# Patient Record
Sex: Male | Born: 1970 | Race: Black or African American | Hispanic: No | Marital: Single
Health system: Southern US, Community
[De-identification: ages and names within clinical notes are randomized; demographics above are authoritative.]

---

## 2012-10-14 ENCOUNTER — Ambulatory Visit (HOSPITAL_COMMUNITY)
Admission: AD | Admit: 2012-10-14 | Discharge: 2012-10-14 | Disposition: A | Discharge status: Home / Self Care | Payer: Medicare Other | Source: Other Acute Inpatient Hospital | Attending: Internal Medicine | Admitting: Internal Medicine

## 2012-10-14 ENCOUNTER — Other Ambulatory Visit (HOSPITAL_COMMUNITY): Payer: Medicare Other

## 2012-10-14 ENCOUNTER — Inpatient Hospital Stay
Admission: AD | Admit: 2012-10-14 | Discharge: 2012-11-22 | Disposition: A | Discharge status: Skilled Nursing Facility | Payer: Medicare Other | Source: Ambulatory Visit | Attending: Internal Medicine | Admitting: Internal Medicine

## 2012-10-14 DIAGNOSIS — J9600 Acute respiratory failure, unspecified whether with hypoxia or hypercapnia: Secondary | ICD-10-CM | POA: Insufficient documentation

## 2012-10-14 MED ORDER — IOHEXOL 300 MG/ML  SOLN
50.0000 mL | Freq: Once | INTRAMUSCULAR | Status: AC | PRN
  Administered 2012-10-14: 50 mL

## 2012-10-15 ENCOUNTER — Other Ambulatory Visit (HOSPITAL_COMMUNITY): Payer: Medicare Other

## 2012-10-15 LAB — SEDIMENTATION RATE: Sed Rate: 26 mm/hr — ABNORMAL HIGH (ref 0–16)

## 2012-10-15 LAB — CBC
MCH: 28.9 pg (ref 26.0–34.0)
MCV: 84.9 fL (ref 78.0–100.0)
Platelets: 263 10*3/uL (ref 150–400)
RBC: 3.91 MIL/uL — ABNORMAL LOW (ref 4.22–5.81)
RDW: 15.3 % (ref 11.5–15.5)
WBC: 14.9 10*3/uL — ABNORMAL HIGH (ref 4.0–10.5)

## 2012-10-15 LAB — COMPREHENSIVE METABOLIC PANEL
ALT: 53 U/L (ref 0–53)
AST: 45 U/L — ABNORMAL HIGH (ref 0–37)
Albumin: 3 g/dL — ABNORMAL LOW (ref 3.5–5.2)
Alkaline Phosphatase: 96 U/L (ref 39–117)
Calcium: 9.2 mg/dL (ref 8.4–10.5)
Chloride: 100 mEq/L (ref 96–112)
Creatinine, Ser: 0.57 mg/dL (ref 0.50–1.35)
GFR calc Af Amer: >90 mL/min (ref >90)
GFR calc non Af Amer: >90 mL/min (ref >90)
Sodium: 136 mEq/L (ref 135–145)
Total Bilirubin: 0.3 mg/dL (ref 0.3–1.2)

## 2012-10-15 LAB — PREALBUMIN: Prealbumin: 14 mg/dL — ABNORMAL LOW (ref 17.0–34.0)

## 2012-10-18 ENCOUNTER — Other Ambulatory Visit (HOSPITAL_COMMUNITY): Payer: Medicare Other

## 2012-10-18 LAB — CBC
HCT: 36.3 % — ABNORMAL LOW (ref 39.0–52.0)
MCH: 29.1 pg (ref 26.0–34.0)
MCHC: 34.7 g/dL (ref 30.0–36.0)
MCV: 83.8 fL (ref 78.0–100.0)
RDW: 15 % (ref 11.5–15.5)

## 2012-10-18 LAB — BASIC METABOLIC PANEL
BUN: 22 mg/dL (ref 6–23)
Creatinine, Ser: 0.51 mg/dL (ref 0.50–1.35)
GFR calc Af Amer: >90 mL/min (ref >90)
GFR calc non Af Amer: >90 mL/min (ref >90)
Glucose, Bld: 123 mg/dL — ABNORMAL HIGH (ref 70–99)

## 2012-10-19 ENCOUNTER — Other Ambulatory Visit (HOSPITAL_COMMUNITY): Payer: Medicare Other

## 2012-10-22 LAB — CBC
HCT: 35.7 % — ABNORMAL LOW (ref 39.0–52.0)
MCV: 83 fL (ref 78.0–100.0)
Platelets: 290 10*3/uL (ref 150–400)
RBC: 4.3 MIL/uL (ref 4.22–5.81)
WBC: 3.3 10*3/uL — ABNORMAL LOW (ref 4.0–10.5)

## 2012-10-22 LAB — BASIC METABOLIC PANEL
CO2: 27 mEq/L (ref 19–32)
Chloride: 97 mEq/L (ref 96–112)
Creatinine, Ser: 0.64 mg/dL (ref 0.50–1.35)
GFR calc Af Amer: >90 mL/min (ref >90)
Potassium: 4.8 mEq/L (ref 3.5–5.1)

## 2012-10-22 LAB — MAGNESIUM: Magnesium: 1.9 mg/dL (ref 1.5–2.5)

## 2012-10-23 LAB — CULTURE, RESPIRATORY W GRAM STAIN

## 2012-10-24 LAB — CBC WITH DIFFERENTIAL/PLATELET
Basophils Absolute: 0 10*3/uL (ref 0.0–0.1)
Basophils Relative: 0 % (ref 0–1)
Eosinophils Absolute: 0 10*3/uL (ref 0.0–0.7)
Eosinophils Relative: 2 % (ref 0–5)
HCT: 39.1 % (ref 39.0–52.0)
Lymphocytes Relative: 29 % (ref 12–46)
MCH: 29.4 pg (ref 26.0–34.0)
MCHC: 35 g/dL (ref 30.0–36.0)
MCV: 83.9 fL (ref 78.0–100.0)
Monocytes Absolute: 0.3 10*3/uL (ref 0.1–1.0)
Platelets: 240 10*3/uL (ref 150–400)
RDW: 15.2 % (ref 11.5–15.5)
WBC: 2.5 10*3/uL — ABNORMAL LOW (ref 4.0–10.5)

## 2012-10-25 ENCOUNTER — Other Ambulatory Visit (HOSPITAL_COMMUNITY): Payer: Medicare Other

## 2012-10-25 LAB — CBC
HCT: 35.1 % — ABNORMAL LOW (ref 39.0–52.0)
MCHC: 34.8 g/dL (ref 30.0–36.0)
Platelets: 274 10*3/uL (ref 150–400)
RDW: 15 % (ref 11.5–15.5)
WBC: 3.1 10*3/uL — ABNORMAL LOW (ref 4.0–10.5)

## 2012-10-25 LAB — BASIC METABOLIC PANEL
BUN: 14 mg/dL (ref 6–23)
Chloride: 98 mEq/L (ref 96–112)
GFR calc Af Amer: >90 mL/min (ref >90)
GFR calc non Af Amer: >90 mL/min (ref >90)
Potassium: 4.7 mEq/L (ref 3.5–5.1)
Sodium: 134 mEq/L — ABNORMAL LOW (ref 135–145)

## 2012-10-26 LAB — VANCOMYCIN, TROUGH: Vancomycin Tr: 16.5 ug/mL (ref 10.0–20.0)

## 2012-10-26 LAB — CULTURE, RESPIRATORY W GRAM STAIN

## 2012-10-27 ENCOUNTER — Other Ambulatory Visit (HOSPITAL_COMMUNITY): Payer: Medicare Other

## 2012-10-27 LAB — CBC
Hemoglobin: 13 g/dL (ref 13.0–17.0)
Platelets: 286 10*3/uL (ref 150–400)
RBC: 4.4 MIL/uL (ref 4.22–5.81)
WBC: 7.3 10*3/uL (ref 4.0–10.5)

## 2012-10-27 LAB — BASIC METABOLIC PANEL
CO2: 31 mEq/L (ref 19–32)
Calcium: 9.9 mg/dL (ref 8.4–10.5)
Glucose, Bld: 104 mg/dL — ABNORMAL HIGH (ref 70–99)
Potassium: 4.4 mEq/L (ref 3.5–5.1)
Sodium: 136 mEq/L (ref 135–145)

## 2012-10-29 LAB — BASIC METABOLIC PANEL
CO2: 28 mEq/L (ref 19–32)
Calcium: 9.5 mg/dL (ref 8.4–10.5)
Chloride: 100 mEq/L (ref 96–112)
Creatinine, Ser: 0.66 mg/dL (ref 0.50–1.35)
Glucose, Bld: 108 mg/dL — ABNORMAL HIGH (ref 70–99)
Sodium: 138 mEq/L (ref 135–145)

## 2012-10-29 LAB — CBC
Hemoglobin: 11.5 g/dL — ABNORMAL LOW (ref 13.0–17.0)
MCH: 29.2 pg (ref 26.0–34.0)
MCV: 84.5 fL (ref 78.0–100.0)
Platelets: 208 10*3/uL (ref 150–400)
RBC: 3.94 MIL/uL — ABNORMAL LOW (ref 4.22–5.81)
WBC: 9 10*3/uL (ref 4.0–10.5)

## 2012-11-01 ENCOUNTER — Other Ambulatory Visit (HOSPITAL_COMMUNITY): Payer: Medicare Other

## 2012-11-01 LAB — BASIC METABOLIC PANEL
BUN: 14 mg/dL (ref 6–23)
CO2: 28 mEq/L (ref 19–32)
Calcium: 9 mg/dL (ref 8.4–10.5)
Chloride: 99 mEq/L (ref 96–112)
Creatinine, Ser: 0.43 mg/dL — ABNORMAL LOW (ref 0.50–1.35)
Glucose, Bld: 85 mg/dL (ref 70–99)

## 2012-11-01 LAB — CBC
HCT: 29.2 % — ABNORMAL LOW (ref 39.0–52.0)
MCH: 28.6 pg (ref 26.0–34.0)
MCV: 84.4 fL (ref 78.0–100.0)
RBC: 3.46 MIL/uL — ABNORMAL LOW (ref 4.22–5.81)
WBC: 2.6 10*3/uL — ABNORMAL LOW (ref 4.0–10.5)

## 2012-11-04 LAB — BASIC METABOLIC PANEL
BUN: 17 mg/dL (ref 6–23)
CO2: 31 mEq/L (ref 19–32)
Calcium: 9.7 mg/dL (ref 8.4–10.5)
Creatinine, Ser: 0.55 mg/dL (ref 0.50–1.35)
GFR calc non Af Amer: >90 mL/min (ref >90)
Glucose, Bld: 94 mg/dL (ref 70–99)

## 2012-11-04 LAB — CBC
MCH: 29.3 pg (ref 26.0–34.0)
MCHC: 35.2 g/dL (ref 30.0–36.0)
MCV: 83.4 fL (ref 78.0–100.0)
Platelets: 246 10*3/uL (ref 150–400)
RBC: 3.92 MIL/uL — ABNORMAL LOW (ref 4.22–5.81)
RDW: 15 % (ref 11.5–15.5)

## 2012-11-04 LAB — URINE MICROSCOPIC-ADD ON

## 2012-11-04 LAB — URINALYSIS, ROUTINE W REFLEX MICROSCOPIC
Bilirubin Urine: NEGATIVE
Ketones, ur: NEGATIVE mg/dL
Nitrite: NEGATIVE
Protein, ur: NEGATIVE mg/dL
pH: 7.5 (ref 5.0–8.0)

## 2012-11-06 ENCOUNTER — Other Ambulatory Visit (HOSPITAL_COMMUNITY): Payer: Medicare Other

## 2012-11-06 LAB — URINE CULTURE

## 2012-11-08 ENCOUNTER — Other Ambulatory Visit (HOSPITAL_COMMUNITY): Payer: Medicare Other

## 2012-11-08 LAB — BASIC METABOLIC PANEL
BUN: 20 mg/dL (ref 6–23)
Calcium: 9.6 mg/dL (ref 8.4–10.5)
GFR calc Af Amer: >90 mL/min (ref >90)
GFR calc non Af Amer: >90 mL/min (ref >90)
Glucose, Bld: 97 mg/dL (ref 70–99)
Sodium: 135 mEq/L (ref 135–145)

## 2012-11-08 MED ORDER — IOHEXOL 300 MG/ML  SOLN
50.0000 mL | Freq: Once | INTRAMUSCULAR | Status: AC | PRN
  Administered 2012-11-08: 1 mL

## 2012-11-11 LAB — BASIC METABOLIC PANEL
BUN: 20 mg/dL (ref 6–23)
Creatinine, Ser: 0.55 mg/dL (ref 0.50–1.35)
GFR calc Af Amer: >90 mL/min (ref >90)
GFR calc non Af Amer: >90 mL/min (ref >90)

## 2012-11-11 LAB — CBC
HCT: 30.1 % — ABNORMAL LOW (ref 39.0–52.0)
MCH: 29.6 pg (ref 26.0–34.0)
MCHC: 34.6 g/dL (ref 30.0–36.0)
MCV: 85.8 fL (ref 78.0–100.0)
RDW: 16 % — ABNORMAL HIGH (ref 11.5–15.5)

## 2012-11-15 LAB — BASIC METABOLIC PANEL
BUN: 20 mg/dL (ref 6–23)
GFR calc Af Amer: >90 mL/min (ref >90)
GFR calc non Af Amer: >90 mL/min (ref >90)
Potassium: 4.1 mEq/L (ref 3.5–5.1)
Sodium: 135 mEq/L (ref 135–145)

## 2012-11-15 LAB — CBC
MCHC: 34.5 g/dL (ref 30.0–36.0)
RDW: 16.4 % — ABNORMAL HIGH (ref 11.5–15.5)

## 2014-02-07 ENCOUNTER — Inpatient Hospital Stay
Admission: AD | Admit: 2014-02-07 | Discharge: 2014-03-13 | Disposition: A | Discharge status: Skilled Nursing Facility | Payer: Self-pay | Source: Ambulatory Visit | Attending: Internal Medicine | Admitting: Internal Medicine

## 2014-02-07 ENCOUNTER — Other Ambulatory Visit (HOSPITAL_COMMUNITY): Payer: Self-pay

## 2014-02-07 DIAGNOSIS — IMO0002 Reserved for concepts with insufficient information to code with codable children: Secondary | ICD-10-CM

## 2014-02-07 DIAGNOSIS — J90 Pleural effusion, not elsewhere classified: Secondary | ICD-10-CM

## 2014-02-07 DIAGNOSIS — I509 Heart failure, unspecified: Secondary | ICD-10-CM

## 2014-02-07 DIAGNOSIS — R069 Unspecified abnormalities of breathing: Secondary | ICD-10-CM

## 2014-02-07 DIAGNOSIS — Z452 Encounter for adjustment and management of vascular access device: Secondary | ICD-10-CM

## 2014-02-07 DIAGNOSIS — J189 Pneumonia, unspecified organism: Secondary | ICD-10-CM

## 2014-02-07 DIAGNOSIS — Z9889 Other specified postprocedural states: Secondary | ICD-10-CM

## 2014-02-07 LAB — VANCOMYCIN, RANDOM: Vancomycin Rm: 15.1 ug/mL

## 2014-02-08 ENCOUNTER — Other Ambulatory Visit (HOSPITAL_COMMUNITY): Payer: Self-pay

## 2014-02-08 LAB — CBC WITH DIFFERENTIAL/PLATELET
BASOS PCT: 0 % (ref 0–1)
Basophils Absolute: 0 10*3/uL (ref 0.0–0.1)
Eosinophils Absolute: 0.2 10*3/uL (ref 0.0–0.7)
Eosinophils Relative: 3 % (ref 0–5)
HCT: 35.5 % — ABNORMAL LOW (ref 39.0–52.0)
HEMOGLOBIN: 11.4 g/dL — AB (ref 13.0–17.0)
Lymphocytes Relative: 12 % (ref 12–46)
Lymphs Abs: 0.6 10*3/uL — ABNORMAL LOW (ref 0.7–4.0)
MCH: 30.5 pg (ref 26.0–34.0)
MCHC: 32.1 g/dL (ref 30.0–36.0)
MCV: 94.9 fL (ref 78.0–100.0)
MONOS PCT: 6 % (ref 3–12)
Monocytes Absolute: 0.3 10*3/uL (ref 0.1–1.0)
NEUTROS ABS: 3.6 10*3/uL (ref 1.7–7.7)
NEUTROS PCT: 79 % — AB (ref 43–77)
Platelets: 443 10*3/uL — ABNORMAL HIGH (ref 150–400)
RBC: 3.74 MIL/uL — AB (ref 4.22–5.81)
RDW: 13.6 % (ref 11.5–15.5)
WBC: 4.6 10*3/uL (ref 4.0–10.5)

## 2014-02-08 LAB — PROTIME-INR
INR: 1.18 (ref 0.00–1.49)
Prothrombin Time: 15.1 seconds (ref 11.6–15.2)

## 2014-02-08 LAB — COMPREHENSIVE METABOLIC PANEL
ALBUMIN: 2.4 g/dL — AB (ref 3.5–5.2)
ALK PHOS: 105 U/L (ref 39–117)
ALT: 33 U/L (ref 0–53)
ANION GAP: 9 (ref 5–15)
AST: 28 U/L (ref 0–37)
BILIRUBIN TOTAL: 0.2 mg/dL — AB (ref 0.3–1.2)
BUN: 15 mg/dL (ref 6–23)
CHLORIDE: 97 meq/L (ref 96–112)
CO2: 32 mEq/L (ref 19–32)
Calcium: 8.8 mg/dL (ref 8.4–10.5)
Creatinine, Ser: 0.66 mg/dL (ref 0.50–1.35)
GFR calc Af Amer: >90 mL/min (ref >90)
GFR calc non Af Amer: >90 mL/min (ref >90)
Glucose, Bld: 90 mg/dL (ref 70–99)
POTASSIUM: 4.6 meq/L (ref 3.7–5.3)
SODIUM: 138 meq/L (ref 137–147)
Total Protein: 7.5 g/dL (ref 6.0–8.3)

## 2014-02-08 LAB — MAGNESIUM: Magnesium: 2.3 mg/dL (ref 1.5–2.5)

## 2014-02-08 LAB — PHOSPHORUS: Phosphorus: 3.6 mg/dL (ref 2.3–4.6)

## 2014-02-08 LAB — T4, FREE: Free T4: 0.93 ng/dL (ref 0.80–1.80)

## 2014-02-08 LAB — TSH: TSH: 3.34 u[IU]/mL (ref 0.350–4.500)

## 2014-02-08 LAB — PRO B NATRIURETIC PEPTIDE: Pro B Natriuretic peptide (BNP): 569.9 pg/mL — ABNORMAL HIGH (ref 0–125)

## 2014-02-08 LAB — HEMOGLOBIN A1C
HEMOGLOBIN A1C: 5.2 % (ref <5.7)
MEAN PLASMA GLUCOSE: 103 mg/dL (ref <117)

## 2014-02-08 LAB — VITAMIN B12: VITAMIN B 12: 1875 pg/mL — AB (ref 211–911)

## 2014-02-08 LAB — PROCALCITONIN

## 2014-02-08 MED ORDER — IOHEXOL 300 MG/ML  SOLN
50.0000 mL | Freq: Once | INTRAMUSCULAR | Status: AC | PRN
  Administered 2014-02-08: 30 mL

## 2014-02-09 ENCOUNTER — Other Ambulatory Visit (HOSPITAL_COMMUNITY): Payer: Self-pay

## 2014-02-09 LAB — URINALYSIS, ROUTINE W REFLEX MICROSCOPIC
BILIRUBIN URINE: NEGATIVE
Glucose, UA: NEGATIVE mg/dL
Ketones, ur: NEGATIVE mg/dL
Leukocytes, UA: NEGATIVE
Nitrite: NEGATIVE
Protein, ur: NEGATIVE mg/dL
Specific Gravity, Urine: 1.009 (ref 1.005–1.030)
UROBILINOGEN UA: 0.2 mg/dL (ref 0.0–1.0)
pH: 8 (ref 5.0–8.0)

## 2014-02-09 LAB — BASIC METABOLIC PANEL
ANION GAP: 7 (ref 5–15)
BUN: 16 mg/dL (ref 6–23)
CALCIUM: 8.3 mg/dL — AB (ref 8.4–10.5)
CO2: 33 meq/L — AB (ref 19–32)
Chloride: 100 mEq/L (ref 96–112)
Creatinine, Ser: 0.64 mg/dL (ref 0.50–1.35)
GFR calc Af Amer: >90 mL/min (ref >90)
GFR calc non Af Amer: >90 mL/min (ref >90)
Glucose, Bld: 101 mg/dL — ABNORMAL HIGH (ref 70–99)
POTASSIUM: 3.9 meq/L (ref 3.7–5.3)
SODIUM: 140 meq/L (ref 137–147)

## 2014-02-09 LAB — CBC WITH DIFFERENTIAL/PLATELET
BASOS ABS: 0 10*3/uL (ref 0.0–0.1)
Basophils Relative: 1 % (ref 0–1)
EOS PCT: 2 % (ref 0–5)
Eosinophils Absolute: 0.1 10*3/uL (ref 0.0–0.7)
HCT: 29 % — ABNORMAL LOW (ref 39.0–52.0)
Hemoglobin: 9.5 g/dL — ABNORMAL LOW (ref 13.0–17.0)
Lymphocytes Relative: 12 % (ref 12–46)
Lymphs Abs: 0.5 10*3/uL — ABNORMAL LOW (ref 0.7–4.0)
MCH: 30.5 pg (ref 26.0–34.0)
MCHC: 32.8 g/dL (ref 30.0–36.0)
MCV: 93.2 fL (ref 78.0–100.0)
Monocytes Absolute: 0.4 10*3/uL (ref 0.1–1.0)
Monocytes Relative: 8 % (ref 3–12)
NEUTROS ABS: 3.4 10*3/uL (ref 1.7–7.7)
NEUTROS PCT: 77 % (ref 43–77)
Platelets: 544 10*3/uL — ABNORMAL HIGH (ref 150–400)
RBC: 3.11 MIL/uL — ABNORMAL LOW (ref 4.22–5.81)
RDW: 13.8 % (ref 11.5–15.5)
WBC: 4.4 10*3/uL (ref 4.0–10.5)

## 2014-02-09 LAB — MAGNESIUM: MAGNESIUM: 2.3 mg/dL (ref 1.5–2.5)

## 2014-02-09 LAB — URINE MICROSCOPIC-ADD ON

## 2014-02-09 LAB — FOLATE RBC: RBC Folate: 1439 ng/mL — ABNORMAL HIGH (ref >280)

## 2014-02-09 LAB — PHOSPHORUS: Phosphorus: 3.2 mg/dL (ref 2.3–4.6)

## 2014-02-10 ENCOUNTER — Other Ambulatory Visit (HOSPITAL_COMMUNITY): Payer: Self-pay

## 2014-02-11 ENCOUNTER — Other Ambulatory Visit (HOSPITAL_COMMUNITY): Payer: Self-pay

## 2014-02-11 LAB — URINE CULTURE
Colony Count: NO GROWTH
Culture: NO GROWTH
SPECIAL REQUESTS: NORMAL

## 2014-02-11 LAB — CK TOTAL AND CKMB (NOT AT ARMC)
CK, MB: 12.9 ng/mL (ref 0.3–4.0)
Relative Index: 4.9 — ABNORMAL HIGH (ref 0.0–2.5)
Total CK: 262 U/L — ABNORMAL HIGH (ref 7–232)

## 2014-02-11 LAB — BODY FLUID CELL COUNT WITH DIFFERENTIAL
LYMPHS FL: 14 %
MONOCYTE-MACROPHAGE-SEROUS FLUID: 73 % (ref 50–90)
NEUTROPHIL FLUID: 11 % (ref 0–25)
Other Cells, Fluid: 2 %
WBC FLUID: 1179 uL — AB (ref 0–1000)

## 2014-02-11 LAB — LACTATE DEHYDROGENASE, PLEURAL OR PERITONEAL FLUID: LD, Fluid: 215 U/L — ABNORMAL HIGH (ref 3–23)

## 2014-02-11 LAB — VITAMIN D 1,25 DIHYDROXY
VITAMIN D 1, 25 (OH) TOTAL: 28 pg/mL (ref 18–72)
VITAMIN D3 1, 25 (OH): 28 pg/mL
Vitamin D2 1, 25 (OH)2: <8 pg/mL

## 2014-02-11 LAB — TROPONIN I

## 2014-02-11 NOTE — Procedures (Signed)
Successful US guided left thoracentesis. Yielded 550 ml of clear yellow fluid. Pt tolerated procedure well. No immediate complications.  Specimen was sent for labs. CXR ordered.  Pattricia BossMORGAN, Trayden Brandy D PA-C 02/11/2014 9:46 AM

## 2014-02-12 ENCOUNTER — Other Ambulatory Visit (HOSPITAL_COMMUNITY): Payer: Self-pay

## 2014-02-12 LAB — BASIC METABOLIC PANEL
ANION GAP: 9 (ref 5–15)
BUN: 22 mg/dL (ref 6–23)
CO2: 28 mEq/L (ref 19–32)
Calcium: 8.5 mg/dL (ref 8.4–10.5)
Chloride: 98 mEq/L (ref 96–112)
Creatinine, Ser: 0.67 mg/dL (ref 0.50–1.35)
Glucose, Bld: 94 mg/dL (ref 70–99)
POTASSIUM: 4.5 meq/L (ref 3.7–5.3)
Sodium: 135 mEq/L — ABNORMAL LOW (ref 137–147)

## 2014-02-12 LAB — CBC WITH DIFFERENTIAL/PLATELET
BASOS ABS: 0.1 10*3/uL (ref 0.0–0.1)
BASOS PCT: 1 % (ref 0–1)
EOS ABS: 0.2 10*3/uL (ref 0.0–0.7)
Eosinophils Relative: 4 % (ref 0–5)
HCT: 29.9 % — ABNORMAL LOW (ref 39.0–52.0)
HEMOGLOBIN: 9.7 g/dL — AB (ref 13.0–17.0)
Lymphocytes Relative: 24 % (ref 12–46)
Lymphs Abs: 1 10*3/uL (ref 0.7–4.0)
MCH: 29.7 pg (ref 26.0–34.0)
MCHC: 32.4 g/dL (ref 30.0–36.0)
MCV: 91.4 fL (ref 78.0–100.0)
Monocytes Absolute: 0.4 10*3/uL (ref 0.1–1.0)
Monocytes Relative: 9 % (ref 3–12)
NEUTROS PCT: 62 % (ref 43–77)
Neutro Abs: 2.7 10*3/uL (ref 1.7–7.7)
PLATELETS: 632 10*3/uL — AB (ref 150–400)
RBC: 3.27 MIL/uL — ABNORMAL LOW (ref 4.22–5.81)
RDW: 13.8 % (ref 11.5–15.5)
WBC: 4.2 10*3/uL (ref 4.0–10.5)

## 2014-02-12 LAB — CK: Total CK: 375 U/L — ABNORMAL HIGH (ref 7–232)

## 2014-02-13 LAB — CK: Total CK: 248 U/L — ABNORMAL HIGH (ref 7–232)

## 2014-02-13 LAB — PATHOLOGIST SMEAR REVIEW

## 2014-02-13 LAB — VANCOMYCIN, TROUGH: VANCOMYCIN TR: 25.4 ug/mL — AB (ref 10.0–20.0)

## 2014-02-14 ENCOUNTER — Other Ambulatory Visit (HOSPITAL_COMMUNITY): Payer: Self-pay

## 2014-02-14 ENCOUNTER — Encounter: Payer: Self-pay | Admitting: Radiology

## 2014-02-14 LAB — CBC WITH DIFFERENTIAL/PLATELET
Basophils Absolute: 0.1 10*3/uL (ref 0.0–0.1)
Basophils Relative: 2 % — ABNORMAL HIGH (ref 0–1)
Eosinophils Absolute: 0.1 10*3/uL (ref 0.0–0.7)
Eosinophils Relative: 3 % (ref 0–5)
HCT: 29.6 % — ABNORMAL LOW (ref 39.0–52.0)
Hemoglobin: 9.7 g/dL — ABNORMAL LOW (ref 13.0–17.0)
Lymphocytes Relative: 22 % (ref 12–46)
Lymphs Abs: 0.9 10*3/uL (ref 0.7–4.0)
MCH: 29.9 pg (ref 26.0–34.0)
MCHC: 32.8 g/dL (ref 30.0–36.0)
MCV: 91.4 fL (ref 78.0–100.0)
Monocytes Absolute: 0.5 10*3/uL (ref 0.1–1.0)
Monocytes Relative: 12 % (ref 3–12)
NEUTROS PCT: 61 % (ref 43–77)
Neutro Abs: 2.5 10*3/uL (ref 1.7–7.7)
Platelets: 558 10*3/uL — ABNORMAL HIGH (ref 150–400)
RBC: 3.24 MIL/uL — ABNORMAL LOW (ref 4.22–5.81)
RDW: 13.4 % (ref 11.5–15.5)
WBC: 4.1 10*3/uL (ref 4.0–10.5)

## 2014-02-14 LAB — COMPREHENSIVE METABOLIC PANEL
ALK PHOS: 118 U/L — AB (ref 39–117)
ALT: 28 U/L (ref 0–53)
AST: 39 U/L — ABNORMAL HIGH (ref 0–37)
Albumin: 2.7 g/dL — ABNORMAL LOW (ref 3.5–5.2)
Anion gap: 16 — ABNORMAL HIGH (ref 5–15)
BUN: 26 mg/dL — ABNORMAL HIGH (ref 6–23)
CO2: 23 mEq/L (ref 19–32)
Calcium: 9.2 mg/dL (ref 8.4–10.5)
Chloride: 96 mEq/L (ref 96–112)
Creatinine, Ser: 0.75 mg/dL (ref 0.50–1.35)
GFR calc non Af Amer: >90 mL/min (ref >90)
GLUCOSE: 73 mg/dL (ref 70–99)
POTASSIUM: 5 meq/L (ref 3.7–5.3)
Sodium: 135 mEq/L — ABNORMAL LOW (ref 137–147)
Total Bilirubin: 0.2 mg/dL — ABNORMAL LOW (ref 0.3–1.2)
Total Protein: 7.5 g/dL (ref 6.0–8.3)

## 2014-02-14 LAB — CULTURE, BLOOD (ROUTINE X 2)
CULTURE: NO GROWTH
Culture: NO GROWTH

## 2014-02-14 LAB — CULTURE, RESPIRATORY W GRAM STAIN

## 2014-02-14 LAB — CULTURE, RESPIRATORY: SPECIAL REQUESTS: NORMAL

## 2014-02-14 LAB — VANCOMYCIN, TROUGH: Vancomycin Tr: 11.3 ug/mL (ref 10.0–20.0)

## 2014-02-16 LAB — BASIC METABOLIC PANEL
ANION GAP: 10 (ref 5–15)
BUN: 29 mg/dL — ABNORMAL HIGH (ref 6–23)
CALCIUM: 8.9 mg/dL (ref 8.4–10.5)
CO2: 28 mEq/L (ref 19–32)
Chloride: 98 mEq/L (ref 96–112)
Creatinine, Ser: 0.75 mg/dL (ref 0.50–1.35)
GFR calc Af Amer: >90 mL/min (ref >90)
Glucose, Bld: 104 mg/dL — ABNORMAL HIGH (ref 70–99)
Potassium: 4.7 mEq/L (ref 3.7–5.3)
SODIUM: 136 meq/L — AB (ref 137–147)

## 2014-02-16 LAB — CBC WITH DIFFERENTIAL/PLATELET
BASOS ABS: 0.1 10*3/uL (ref 0.0–0.1)
BASOS PCT: 2 % — AB (ref 0–1)
EOS ABS: 0.2 10*3/uL (ref 0.0–0.7)
EOS PCT: 7 % — AB (ref 0–5)
HCT: 28.7 % — ABNORMAL LOW (ref 39.0–52.0)
Hemoglobin: 9.4 g/dL — ABNORMAL LOW (ref 13.0–17.0)
Lymphocytes Relative: 29 % (ref 12–46)
Lymphs Abs: 0.9 10*3/uL (ref 0.7–4.0)
MCH: 30.1 pg (ref 26.0–34.0)
MCHC: 32.8 g/dL (ref 30.0–36.0)
MCV: 92 fL (ref 78.0–100.0)
Monocytes Absolute: 0.3 10*3/uL (ref 0.1–1.0)
Monocytes Relative: 10 % (ref 3–12)
Neutro Abs: 1.7 10*3/uL (ref 1.7–7.7)
Neutrophils Relative %: 53 % (ref 43–77)
PLATELETS: 390 10*3/uL (ref 150–400)
RBC: 3.12 MIL/uL — ABNORMAL LOW (ref 4.22–5.81)
RDW: 13.3 % (ref 11.5–15.5)
WBC: 3.3 10*3/uL — AB (ref 4.0–10.5)

## 2014-02-16 LAB — CK: Total CK: 218 U/L (ref 7–232)

## 2014-02-17 ENCOUNTER — Other Ambulatory Visit (HOSPITAL_COMMUNITY): Payer: Self-pay

## 2014-02-17 LAB — IRON: Iron: 35 ug/dL — ABNORMAL LOW (ref 42–135)

## 2014-02-17 LAB — VANCOMYCIN, TROUGH: Vancomycin Tr: 8.8 ug/mL — ABNORMAL LOW (ref 10.0–20.0)

## 2014-02-17 LAB — MISCELLANEOUS TEST

## 2014-02-18 ENCOUNTER — Other Ambulatory Visit (HOSPITAL_COMMUNITY): Payer: Self-pay

## 2014-02-18 LAB — BASIC METABOLIC PANEL
Anion gap: 12 (ref 5–15)
BUN: 29 mg/dL — AB (ref 6–23)
CHLORIDE: 96 meq/L (ref 96–112)
CO2: 29 mEq/L (ref 19–32)
CREATININE: 0.73 mg/dL (ref 0.50–1.35)
Calcium: 9.2 mg/dL (ref 8.4–10.5)
GFR calc Af Amer: >90 mL/min (ref >90)
Glucose, Bld: 102 mg/dL — ABNORMAL HIGH (ref 70–99)
Potassium: 4.2 mEq/L (ref 3.7–5.3)
Sodium: 137 mEq/L (ref 137–147)

## 2014-02-18 LAB — CBC
HEMATOCRIT: 30.9 % — AB (ref 39.0–52.0)
Hemoglobin: 10.1 g/dL — ABNORMAL LOW (ref 13.0–17.0)
MCH: 29.2 pg (ref 26.0–34.0)
MCHC: 32.7 g/dL (ref 30.0–36.0)
MCV: 89.3 fL (ref 78.0–100.0)
Platelets: 360 10*3/uL (ref 150–400)
RBC: 3.46 MIL/uL — ABNORMAL LOW (ref 4.22–5.81)
RDW: 13 % (ref 11.5–15.5)
WBC: 2.7 10*3/uL — ABNORMAL LOW (ref 4.0–10.5)

## 2014-02-20 LAB — COMPREHENSIVE METABOLIC PANEL
ALT: 26 U/L (ref 0–53)
ANION GAP: 9 (ref 5–15)
AST: 25 U/L (ref 0–37)
Albumin: 2.8 g/dL — ABNORMAL LOW (ref 3.5–5.2)
Alkaline Phosphatase: 118 U/L — ABNORMAL HIGH (ref 39–117)
BILIRUBIN TOTAL: 0.3 mg/dL (ref 0.3–1.2)
BUN: 33 mg/dL — AB (ref 6–23)
CALCIUM: 9.1 mg/dL (ref 8.4–10.5)
CO2: 30 meq/L (ref 19–32)
CREATININE: 0.8 mg/dL (ref 0.50–1.35)
Chloride: 98 mEq/L (ref 96–112)
Glucose, Bld: 68 mg/dL — ABNORMAL LOW (ref 70–99)
Potassium: 4.3 mEq/L (ref 3.7–5.3)
Sodium: 137 mEq/L (ref 137–147)
Total Protein: 7.4 g/dL (ref 6.0–8.3)

## 2014-02-20 LAB — CBC WITH DIFFERENTIAL/PLATELET
BASOS PCT: 2 % — AB (ref 0–1)
Basophils Absolute: 0 10*3/uL (ref 0.0–0.1)
Eosinophils Absolute: 0.2 10*3/uL (ref 0.0–0.7)
Eosinophils Relative: 8 % — ABNORMAL HIGH (ref 0–5)
HCT: 28.1 % — ABNORMAL LOW (ref 39.0–52.0)
HEMOGLOBIN: 9.2 g/dL — AB (ref 13.0–17.0)
LYMPHS ABS: 0.9 10*3/uL (ref 0.7–4.0)
Lymphocytes Relative: 43 % (ref 12–46)
MCH: 29.7 pg (ref 26.0–34.0)
MCHC: 32.7 g/dL (ref 30.0–36.0)
MCV: 90.6 fL (ref 78.0–100.0)
MONO ABS: 0.3 10*3/uL (ref 0.1–1.0)
Monocytes Relative: 12 % (ref 3–12)
NEUTROS PCT: 35 % — AB (ref 43–77)
Neutro Abs: 0.7 10*3/uL — ABNORMAL LOW (ref 1.7–7.7)
Platelets: 293 10*3/uL (ref 150–400)
RBC: 3.1 MIL/uL — ABNORMAL LOW (ref 4.22–5.81)
RDW: 13.2 % (ref 11.5–15.5)
WBC: 2.1 10*3/uL — ABNORMAL LOW (ref 4.0–10.5)

## 2014-02-20 LAB — VANCOMYCIN, TROUGH: Vancomycin Tr: <5 ug/mL — ABNORMAL LOW (ref 10.0–20.0)

## 2014-02-21 LAB — CK TOTAL AND CKMB (NOT AT ARMC)
CK TOTAL: 381 U/L — AB (ref 7–232)
CK, MB: 13.9 ng/mL (ref 0.3–4.0)
Relative Index: 3.6 — ABNORMAL HIGH (ref 0.0–2.5)

## 2014-02-21 LAB — CBC
HCT: 28.7 % — ABNORMAL LOW (ref 39.0–52.0)
Hemoglobin: 9.5 g/dL — ABNORMAL LOW (ref 13.0–17.0)
MCH: 29.2 pg (ref 26.0–34.0)
MCHC: 33.1 g/dL (ref 30.0–36.0)
MCV: 88.3 fL (ref 78.0–100.0)
PLATELETS: 238 10*3/uL (ref 150–400)
RBC: 3.25 MIL/uL — ABNORMAL LOW (ref 4.22–5.81)
RDW: 13 % (ref 11.5–15.5)
WBC: 2.6 10*3/uL — AB (ref 4.0–10.5)

## 2014-02-21 LAB — T-HELPER CELLS (CD4) COUNT (NOT AT ARMC)
CD4 T CELL HELPER: 65 % — AB (ref 33–55)
CD4 T Cell Abs: 620 /uL (ref 400–2700)

## 2014-02-22 LAB — CBC WITH DIFFERENTIAL/PLATELET
BASOS ABS: 0 10*3/uL (ref 0.0–0.1)
Basophils Relative: 1 % (ref 0–1)
EOS ABS: 0.1 10*3/uL (ref 0.0–0.7)
EOS PCT: 5 % (ref 0–5)
HEMATOCRIT: 28.2 % — AB (ref 39.0–52.0)
Hemoglobin: 9.5 g/dL — ABNORMAL LOW (ref 13.0–17.0)
LYMPHS ABS: 0.8 10*3/uL (ref 0.7–4.0)
LYMPHS PCT: 32 % (ref 12–46)
MCH: 29.4 pg (ref 26.0–34.0)
MCHC: 33.7 g/dL (ref 30.0–36.0)
MCV: 87.3 fL (ref 78.0–100.0)
Monocytes Absolute: 0.3 10*3/uL (ref 0.1–1.0)
Monocytes Relative: 12 % (ref 3–12)
Neutro Abs: 1.3 10*3/uL — ABNORMAL LOW (ref 1.7–7.7)
Neutrophils Relative %: 50 % (ref 43–77)
PLATELETS: 212 10*3/uL (ref 150–400)
RBC: 3.23 MIL/uL — ABNORMAL LOW (ref 4.22–5.81)
RDW: 12.8 % (ref 11.5–15.5)
WBC: 2.7 10*3/uL — ABNORMAL LOW (ref 4.0–10.5)

## 2014-02-22 LAB — BASIC METABOLIC PANEL
Anion gap: 10 (ref 5–15)
BUN: 29 mg/dL — ABNORMAL HIGH (ref 6–23)
CO2: 29 meq/L (ref 19–32)
CREATININE: 0.73 mg/dL (ref 0.50–1.35)
Calcium: 9.2 mg/dL (ref 8.4–10.5)
Chloride: 99 mEq/L (ref 96–112)
GFR calc Af Amer: >90 mL/min (ref >90)
GLUCOSE: 97 mg/dL (ref 70–99)
Potassium: 4.1 mEq/L (ref 3.7–5.3)
SODIUM: 138 meq/L (ref 137–147)

## 2014-02-22 LAB — CK TOTAL AND CKMB (NOT AT ARMC)
CK TOTAL: 907 U/L — AB (ref 7–232)
CK, MB: 25.4 ng/mL (ref 0.3–4.0)
Relative Index: 2.8 — ABNORMAL HIGH (ref 0.0–2.5)

## 2014-02-23 ENCOUNTER — Other Ambulatory Visit (HOSPITAL_COMMUNITY): Payer: Self-pay

## 2014-02-23 LAB — CBC WITH DIFFERENTIAL/PLATELET
Basophils Absolute: 0 10*3/uL (ref 0.0–0.1)
Basophils Relative: 1 % (ref 0–1)
EOS ABS: 0.1 10*3/uL (ref 0.0–0.7)
Eosinophils Relative: 5 % (ref 0–5)
HEMATOCRIT: 26.9 % — AB (ref 39.0–52.0)
Hemoglobin: 9.2 g/dL — ABNORMAL LOW (ref 13.0–17.0)
LYMPHS ABS: 0.8 10*3/uL (ref 0.7–4.0)
LYMPHS PCT: 34 % (ref 12–46)
MCH: 29.9 pg (ref 26.0–34.0)
MCHC: 34.2 g/dL (ref 30.0–36.0)
MCV: 87.3 fL (ref 78.0–100.0)
MONO ABS: 0.2 10*3/uL (ref 0.1–1.0)
MONOS PCT: 11 % (ref 3–12)
Neutro Abs: 1.1 10*3/uL — ABNORMAL LOW (ref 1.7–7.7)
Neutrophils Relative %: 49 % (ref 43–77)
Platelets: 174 10*3/uL (ref 150–400)
RBC: 3.08 MIL/uL — AB (ref 4.22–5.81)
RDW: 12.9 % (ref 11.5–15.5)
WBC: 2.2 10*3/uL — AB (ref 4.0–10.5)

## 2014-02-23 LAB — BASIC METABOLIC PANEL
Anion gap: 11 (ref 5–15)
BUN: 32 mg/dL — AB (ref 6–23)
CALCIUM: 9.1 mg/dL (ref 8.4–10.5)
CO2: 28 meq/L (ref 19–32)
CREATININE: 0.72 mg/dL (ref 0.50–1.35)
Chloride: 98 mEq/L (ref 96–112)
GFR calc Af Amer: >90 mL/min (ref >90)
GLUCOSE: 97 mg/dL (ref 70–99)
Potassium: 4.7 mEq/L (ref 3.7–5.3)
SODIUM: 137 meq/L (ref 137–147)

## 2014-02-23 LAB — TROPONIN I: Troponin I: <0.3 ng/mL (ref <0.30)

## 2014-02-23 LAB — CK: CK TOTAL: 950 U/L — AB (ref 7–232)

## 2014-02-24 LAB — BASIC METABOLIC PANEL
Anion gap: 9 (ref 5–15)
BUN: 33 mg/dL — AB (ref 6–23)
CHLORIDE: 95 meq/L — AB (ref 96–112)
CO2: 29 meq/L (ref 19–32)
Calcium: 9.2 mg/dL (ref 8.4–10.5)
Creatinine, Ser: 0.73 mg/dL (ref 0.50–1.35)
GFR calc Af Amer: >90 mL/min (ref >90)
GFR calc non Af Amer: >90 mL/min (ref >90)
Glucose, Bld: 96 mg/dL (ref 70–99)
POTASSIUM: 4.3 meq/L (ref 3.7–5.3)
Sodium: 133 mEq/L — ABNORMAL LOW (ref 137–147)

## 2014-02-24 LAB — PROCALCITONIN: Procalcitonin: <0.1 ng/mL

## 2014-02-25 LAB — CBC WITH DIFFERENTIAL/PLATELET
Basophils Absolute: 0 10*3/uL (ref 0.0–0.1)
Basophils Relative: 0 % (ref 0–1)
EOS PCT: 5 % (ref 0–5)
Eosinophils Absolute: 0.2 10*3/uL (ref 0.0–0.7)
HCT: 28.5 % — ABNORMAL LOW (ref 39.0–52.0)
HEMOGLOBIN: 9.5 g/dL — AB (ref 13.0–17.0)
LYMPHS ABS: 0.8 10*3/uL (ref 0.7–4.0)
Lymphocytes Relative: 28 % (ref 12–46)
MCH: 29.6 pg (ref 26.0–34.0)
MCHC: 33.3 g/dL (ref 30.0–36.0)
MCV: 88.8 fL (ref 78.0–100.0)
MONOS PCT: 12 % (ref 3–12)
Monocytes Absolute: 0.4 10*3/uL (ref 0.1–1.0)
Neutro Abs: 1.6 10*3/uL — ABNORMAL LOW (ref 1.7–7.7)
Neutrophils Relative %: 55 % (ref 43–77)
PLATELETS: 157 10*3/uL (ref 150–400)
RBC: 3.21 MIL/uL — AB (ref 4.22–5.81)
RDW: 13 % (ref 11.5–15.5)
WBC: 2.9 10*3/uL — AB (ref 4.0–10.5)

## 2014-02-25 LAB — BASIC METABOLIC PANEL
Anion gap: 11 (ref 5–15)
BUN: 28 mg/dL — AB (ref 6–23)
CO2: 30 mEq/L (ref 19–32)
Calcium: 9.4 mg/dL (ref 8.4–10.5)
Chloride: 96 mEq/L (ref 96–112)
Creatinine, Ser: 0.72 mg/dL (ref 0.50–1.35)
GFR calc Af Amer: >90 mL/min (ref >90)
GFR calc non Af Amer: >90 mL/min (ref >90)
GLUCOSE: 89 mg/dL (ref 70–99)
POTASSIUM: 4.4 meq/L (ref 3.7–5.3)
Sodium: 137 mEq/L (ref 137–147)

## 2014-02-27 LAB — COMPREHENSIVE METABOLIC PANEL
ALT: 33 U/L (ref 0–53)
ANION GAP: 10 (ref 5–15)
AST: 33 U/L (ref 0–37)
Albumin: 3.3 g/dL — ABNORMAL LOW (ref 3.5–5.2)
Alkaline Phosphatase: 134 U/L — ABNORMAL HIGH (ref 39–117)
BUN: 32 mg/dL — AB (ref 6–23)
CALCIUM: 9.4 mg/dL (ref 8.4–10.5)
CO2: 30 meq/L (ref 19–32)
CREATININE: 0.74 mg/dL (ref 0.50–1.35)
Chloride: 98 mEq/L (ref 96–112)
GFR calc Af Amer: >90 mL/min (ref >90)
GFR calc non Af Amer: >90 mL/min (ref >90)
Glucose, Bld: 108 mg/dL — ABNORMAL HIGH (ref 70–99)
Potassium: 4.5 mEq/L (ref 3.7–5.3)
Sodium: 138 mEq/L (ref 137–147)
Total Bilirubin: 0.3 mg/dL (ref 0.3–1.2)
Total Protein: 8.1 g/dL (ref 6.0–8.3)

## 2014-02-27 LAB — CBC
HEMATOCRIT: 29.6 % — AB (ref 39.0–52.0)
Hemoglobin: 10 g/dL — ABNORMAL LOW (ref 13.0–17.0)
MCH: 29.9 pg (ref 26.0–34.0)
MCHC: 33.8 g/dL (ref 30.0–36.0)
MCV: 88.6 fL (ref 78.0–100.0)
PLATELETS: 152 10*3/uL (ref 150–400)
RBC: 3.34 MIL/uL — AB (ref 4.22–5.81)
RDW: 13.2 % (ref 11.5–15.5)
WBC: 3.1 10*3/uL — ABNORMAL LOW (ref 4.0–10.5)

## 2014-03-01 LAB — CBC WITH DIFFERENTIAL/PLATELET
BASOS ABS: 0 10*3/uL (ref 0.0–0.1)
Basophils Relative: 0 % (ref 0–1)
Eosinophils Absolute: 0.1 10*3/uL (ref 0.0–0.7)
Eosinophils Relative: 2 % (ref 0–5)
HCT: 28.4 % — ABNORMAL LOW (ref 39.0–52.0)
Hemoglobin: 9.5 g/dL — ABNORMAL LOW (ref 13.0–17.0)
LYMPHS ABS: 0.8 10*3/uL (ref 0.7–4.0)
Lymphocytes Relative: 27 % (ref 12–46)
MCH: 28.9 pg (ref 26.0–34.0)
MCHC: 33.5 g/dL (ref 30.0–36.0)
MCV: 86.3 fL (ref 78.0–100.0)
Monocytes Absolute: 0.3 10*3/uL (ref 0.1–1.0)
Monocytes Relative: 11 % (ref 3–12)
NEUTROS PCT: 60 % (ref 43–77)
Neutro Abs: 1.7 10*3/uL (ref 1.7–7.7)
PLATELETS: 145 10*3/uL — AB (ref 150–400)
RBC: 3.29 MIL/uL — AB (ref 4.22–5.81)
RDW: 13.3 % (ref 11.5–15.5)
WBC: 2.8 10*3/uL — ABNORMAL LOW (ref 4.0–10.5)

## 2014-03-03 LAB — CBC WITH DIFFERENTIAL/PLATELET
BASOS ABS: 0 10*3/uL (ref 0.0–0.1)
Basophils Relative: 1 % (ref 0–1)
Eosinophils Absolute: 0.1 10*3/uL (ref 0.0–0.7)
Eosinophils Relative: 5 % (ref 0–5)
HEMATOCRIT: 28.5 % — AB (ref 39.0–52.0)
Hemoglobin: 9.8 g/dL — ABNORMAL LOW (ref 13.0–17.0)
LYMPHS ABS: 0.8 10*3/uL (ref 0.7–4.0)
Lymphocytes Relative: 33 % (ref 12–46)
MCH: 30.2 pg (ref 26.0–34.0)
MCHC: 34.4 g/dL (ref 30.0–36.0)
MCV: 88 fL (ref 78.0–100.0)
MONO ABS: 0.3 10*3/uL (ref 0.1–1.0)
Monocytes Relative: 11 % (ref 3–12)
Neutro Abs: 1.2 10*3/uL — ABNORMAL LOW (ref 1.7–7.7)
Neutrophils Relative %: 50 % (ref 43–77)
PLATELETS: 141 10*3/uL — AB (ref 150–400)
RBC: 3.24 MIL/uL — AB (ref 4.22–5.81)
RDW: 13.6 % (ref 11.5–15.5)
WBC: 2.4 10*3/uL — ABNORMAL LOW (ref 4.0–10.5)

## 2014-03-03 LAB — BASIC METABOLIC PANEL
ANION GAP: 10 (ref 5–15)
BUN: 29 mg/dL — ABNORMAL HIGH (ref 6–23)
CHLORIDE: 96 meq/L (ref 96–112)
CO2: 30 meq/L (ref 19–32)
Calcium: 9.2 mg/dL (ref 8.4–10.5)
Creatinine, Ser: 0.65 mg/dL (ref 0.50–1.35)
GFR calc Af Amer: >90 mL/min (ref >90)
GFR calc non Af Amer: >90 mL/min (ref >90)
GLUCOSE: 98 mg/dL (ref 70–99)
Potassium: 4.2 mEq/L (ref 3.7–5.3)
SODIUM: 136 meq/L — AB (ref 137–147)

## 2014-03-03 LAB — MAGNESIUM: MAGNESIUM: 2 mg/dL (ref 1.5–2.5)

## 2014-03-03 LAB — PHOSPHORUS: Phosphorus: 3.5 mg/dL (ref 2.3–4.6)

## 2014-03-04 LAB — CBC
HCT: 28.2 % — ABNORMAL LOW (ref 39.0–52.0)
Hemoglobin: 9.6 g/dL — ABNORMAL LOW (ref 13.0–17.0)
MCH: 29.4 pg (ref 26.0–34.0)
MCHC: 34 g/dL (ref 30.0–36.0)
MCV: 86.5 fL (ref 78.0–100.0)
PLATELETS: 146 10*3/uL — AB (ref 150–400)
RBC: 3.26 MIL/uL — AB (ref 4.22–5.81)
RDW: 13.9 % (ref 11.5–15.5)
WBC: 2.5 10*3/uL — AB (ref 4.0–10.5)

## 2014-03-04 LAB — BASIC METABOLIC PANEL
ANION GAP: 12 (ref 5–15)
BUN: 29 mg/dL — ABNORMAL HIGH (ref 6–23)
CHLORIDE: 96 meq/L (ref 96–112)
CO2: 29 mEq/L (ref 19–32)
Calcium: 9.2 mg/dL (ref 8.4–10.5)
Creatinine, Ser: 0.7 mg/dL (ref 0.50–1.35)
GFR calc Af Amer: >90 mL/min (ref >90)
GFR calc non Af Amer: >90 mL/min (ref >90)
GLUCOSE: 91 mg/dL (ref 70–99)
POTASSIUM: 4.2 meq/L (ref 3.7–5.3)
SODIUM: 137 meq/L (ref 137–147)

## 2014-03-05 ENCOUNTER — Other Ambulatory Visit (HOSPITAL_COMMUNITY): Payer: Self-pay

## 2014-03-05 LAB — CBC
HCT: 27.8 % — ABNORMAL LOW (ref 39.0–52.0)
HEMOGLOBIN: 9.5 g/dL — AB (ref 13.0–17.0)
MCH: 29.6 pg (ref 26.0–34.0)
MCHC: 34.2 g/dL (ref 30.0–36.0)
MCV: 86.6 fL (ref 78.0–100.0)
PLATELETS: 128 10*3/uL — AB (ref 150–400)
RBC: 3.21 MIL/uL — ABNORMAL LOW (ref 4.22–5.81)
RDW: 14 % (ref 11.5–15.5)
WBC: 2.2 10*3/uL — AB (ref 4.0–10.5)

## 2014-03-06 ENCOUNTER — Other Ambulatory Visit (HOSPITAL_COMMUNITY): Payer: Medicare Other

## 2014-03-06 LAB — CBC WITH DIFFERENTIAL/PLATELET
Basophils Absolute: 0 10*3/uL (ref 0.0–0.1)
Basophils Relative: 1 % (ref 0–1)
Eosinophils Absolute: 0.2 10*3/uL (ref 0.0–0.7)
Eosinophils Relative: 6 % — ABNORMAL HIGH (ref 0–5)
HEMATOCRIT: 28.1 % — AB (ref 39.0–52.0)
HEMOGLOBIN: 9.6 g/dL — AB (ref 13.0–17.0)
LYMPHS ABS: 1.1 10*3/uL (ref 0.7–4.0)
Lymphocytes Relative: 36 % (ref 12–46)
MCH: 29.5 pg (ref 26.0–34.0)
MCHC: 34.2 g/dL (ref 30.0–36.0)
MCV: 86.5 fL (ref 78.0–100.0)
Monocytes Absolute: 0.3 10*3/uL (ref 0.1–1.0)
Monocytes Relative: 11 % (ref 3–12)
NEUTROS ABS: 1.4 10*3/uL — AB (ref 1.7–7.7)
Neutrophils Relative %: 46 % (ref 43–77)
Platelets: 139 10*3/uL — ABNORMAL LOW (ref 150–400)
RBC: 3.25 MIL/uL — ABNORMAL LOW (ref 4.22–5.81)
RDW: 14.2 % (ref 11.5–15.5)
WBC: 3 10*3/uL — ABNORMAL LOW (ref 4.0–10.5)

## 2014-03-06 LAB — COMPREHENSIVE METABOLIC PANEL
ALT: 30 U/L (ref 0–53)
AST: 30 U/L (ref 0–37)
Albumin: 3.4 g/dL — ABNORMAL LOW (ref 3.5–5.2)
Alkaline Phosphatase: 127 U/L — ABNORMAL HIGH (ref 39–117)
Anion gap: 12 (ref 5–15)
BUN: 33 mg/dL — ABNORMAL HIGH (ref 6–23)
CHLORIDE: 97 meq/L (ref 96–112)
CO2: 29 meq/L (ref 19–32)
CREATININE: 0.75 mg/dL (ref 0.50–1.35)
Calcium: 9.4 mg/dL (ref 8.4–10.5)
GLUCOSE: 92 mg/dL (ref 70–99)
Potassium: 4.3 mEq/L (ref 3.7–5.3)
Sodium: 138 mEq/L (ref 137–147)
Total Bilirubin: 0.3 mg/dL (ref 0.3–1.2)
Total Protein: 7.9 g/dL (ref 6.0–8.3)

## 2014-03-06 LAB — MAGNESIUM: MAGNESIUM: 2.1 mg/dL (ref 1.5–2.5)

## 2014-03-06 LAB — PHOSPHORUS: Phosphorus: 3.5 mg/dL (ref 2.3–4.6)

## 2014-03-07 LAB — CBC
HCT: 29.7 % — ABNORMAL LOW (ref 39.0–52.0)
Hemoglobin: 10 g/dL — ABNORMAL LOW (ref 13.0–17.0)
MCH: 30.2 pg (ref 26.0–34.0)
MCHC: 33.7 g/dL (ref 30.0–36.0)
MCV: 89.7 fL (ref 78.0–100.0)
Platelets: 129 10*3/uL — ABNORMAL LOW (ref 150–400)
RBC: 3.31 MIL/uL — ABNORMAL LOW (ref 4.22–5.81)
RDW: 14.6 % (ref 11.5–15.5)
WBC: 2.3 10*3/uL — ABNORMAL LOW (ref 4.0–10.5)

## 2014-03-08 LAB — BASIC METABOLIC PANEL
ANION GAP: 11 (ref 5–15)
BUN: 39 mg/dL — ABNORMAL HIGH (ref 6–23)
CALCIUM: 9.2 mg/dL (ref 8.4–10.5)
CHLORIDE: 96 meq/L (ref 96–112)
CO2: 29 mEq/L (ref 19–32)
CREATININE: 0.79 mg/dL (ref 0.50–1.35)
GFR calc Af Amer: >90 mL/min (ref >90)
GFR calc non Af Amer: >90 mL/min (ref >90)
Glucose, Bld: 104 mg/dL — ABNORMAL HIGH (ref 70–99)
Potassium: 4.2 mEq/L (ref 3.7–5.3)
Sodium: 136 mEq/L — ABNORMAL LOW (ref 137–147)

## 2014-03-08 LAB — CBC
HCT: 28.1 % — ABNORMAL LOW (ref 39.0–52.0)
Hemoglobin: 9.3 g/dL — ABNORMAL LOW (ref 13.0–17.0)
MCH: 29.2 pg (ref 26.0–34.0)
MCHC: 33.1 g/dL (ref 30.0–36.0)
MCV: 88.1 fL (ref 78.0–100.0)
PLATELETS: 128 10*3/uL — AB (ref 150–400)
RBC: 3.19 MIL/uL — ABNORMAL LOW (ref 4.22–5.81)
RDW: 14.7 % (ref 11.5–15.5)
WBC: 3.2 10*3/uL — AB (ref 4.0–10.5)

## 2014-03-09 LAB — CBC
HCT: 25.3 % — ABNORMAL LOW (ref 39.0–52.0)
Hemoglobin: 8.5 g/dL — ABNORMAL LOW (ref 13.0–17.0)
MCH: 30 pg (ref 26.0–34.0)
MCHC: 33.6 g/dL (ref 30.0–36.0)
MCV: 89.4 fL (ref 78.0–100.0)
Platelets: 108 10*3/uL — ABNORMAL LOW (ref 150–400)
RBC: 2.83 MIL/uL — ABNORMAL LOW (ref 4.22–5.81)
RDW: 14.8 % (ref 11.5–15.5)
WBC: 3.3 10*3/uL — ABNORMAL LOW (ref 4.0–10.5)

## 2014-03-10 LAB — BASIC METABOLIC PANEL
Anion gap: 9 (ref 5–15)
BUN: 35 mg/dL — ABNORMAL HIGH (ref 6–23)
CO2: 32 meq/L (ref 19–32)
Calcium: 9.1 mg/dL (ref 8.4–10.5)
Chloride: 101 mEq/L (ref 96–112)
Creatinine, Ser: 0.76 mg/dL (ref 0.50–1.35)
GFR calc Af Amer: >90 mL/min (ref >90)
GFR calc non Af Amer: >90 mL/min (ref >90)
GLUCOSE: 100 mg/dL — AB (ref 70–99)
Potassium: 4.1 mEq/L (ref 3.7–5.3)
SODIUM: 142 meq/L (ref 137–147)

## 2014-03-10 LAB — CBC WITH DIFFERENTIAL/PLATELET
Basophils Absolute: 0 10*3/uL (ref 0.0–0.1)
Basophils Relative: 0 % (ref 0–1)
Eosinophils Absolute: 0.1 10*3/uL (ref 0.0–0.7)
Eosinophils Relative: 5 % (ref 0–5)
HCT: 26.1 % — ABNORMAL LOW (ref 39.0–52.0)
Hemoglobin: 8.9 g/dL — ABNORMAL LOW (ref 13.0–17.0)
LYMPHS ABS: 1 10*3/uL (ref 0.7–4.0)
LYMPHS PCT: 38 % (ref 12–46)
MCH: 31.1 pg (ref 26.0–34.0)
MCHC: 34.1 g/dL (ref 30.0–36.0)
MCV: 91.3 fL (ref 78.0–100.0)
Monocytes Absolute: 0.3 10*3/uL (ref 0.1–1.0)
Monocytes Relative: 12 % (ref 3–12)
NEUTROS PCT: 45 % (ref 43–77)
Neutro Abs: 1.2 10*3/uL — ABNORMAL LOW (ref 1.7–7.7)
PLATELETS: 90 10*3/uL — AB (ref 150–400)
RBC: 2.86 MIL/uL — AB (ref 4.22–5.81)
RDW: 14.7 % (ref 11.5–15.5)
WBC: 2.6 10*3/uL — AB (ref 4.0–10.5)

## 2014-03-10 LAB — CULTURE, BLOOD (ROUTINE X 2)
CULTURE: NO GROWTH
Culture: NO GROWTH

## 2014-04-14 DEATH — deceased

## 2016-01-27 IMAGING — CR DG CHEST 1V PORT
1 series · 1 of 1 positions shown · non-contrast
Comparison: 02/09/2014

CLINICAL DATA: Thoracentesis

EXAM:
PORTABLE CHEST - 1 VIEW

[AP]
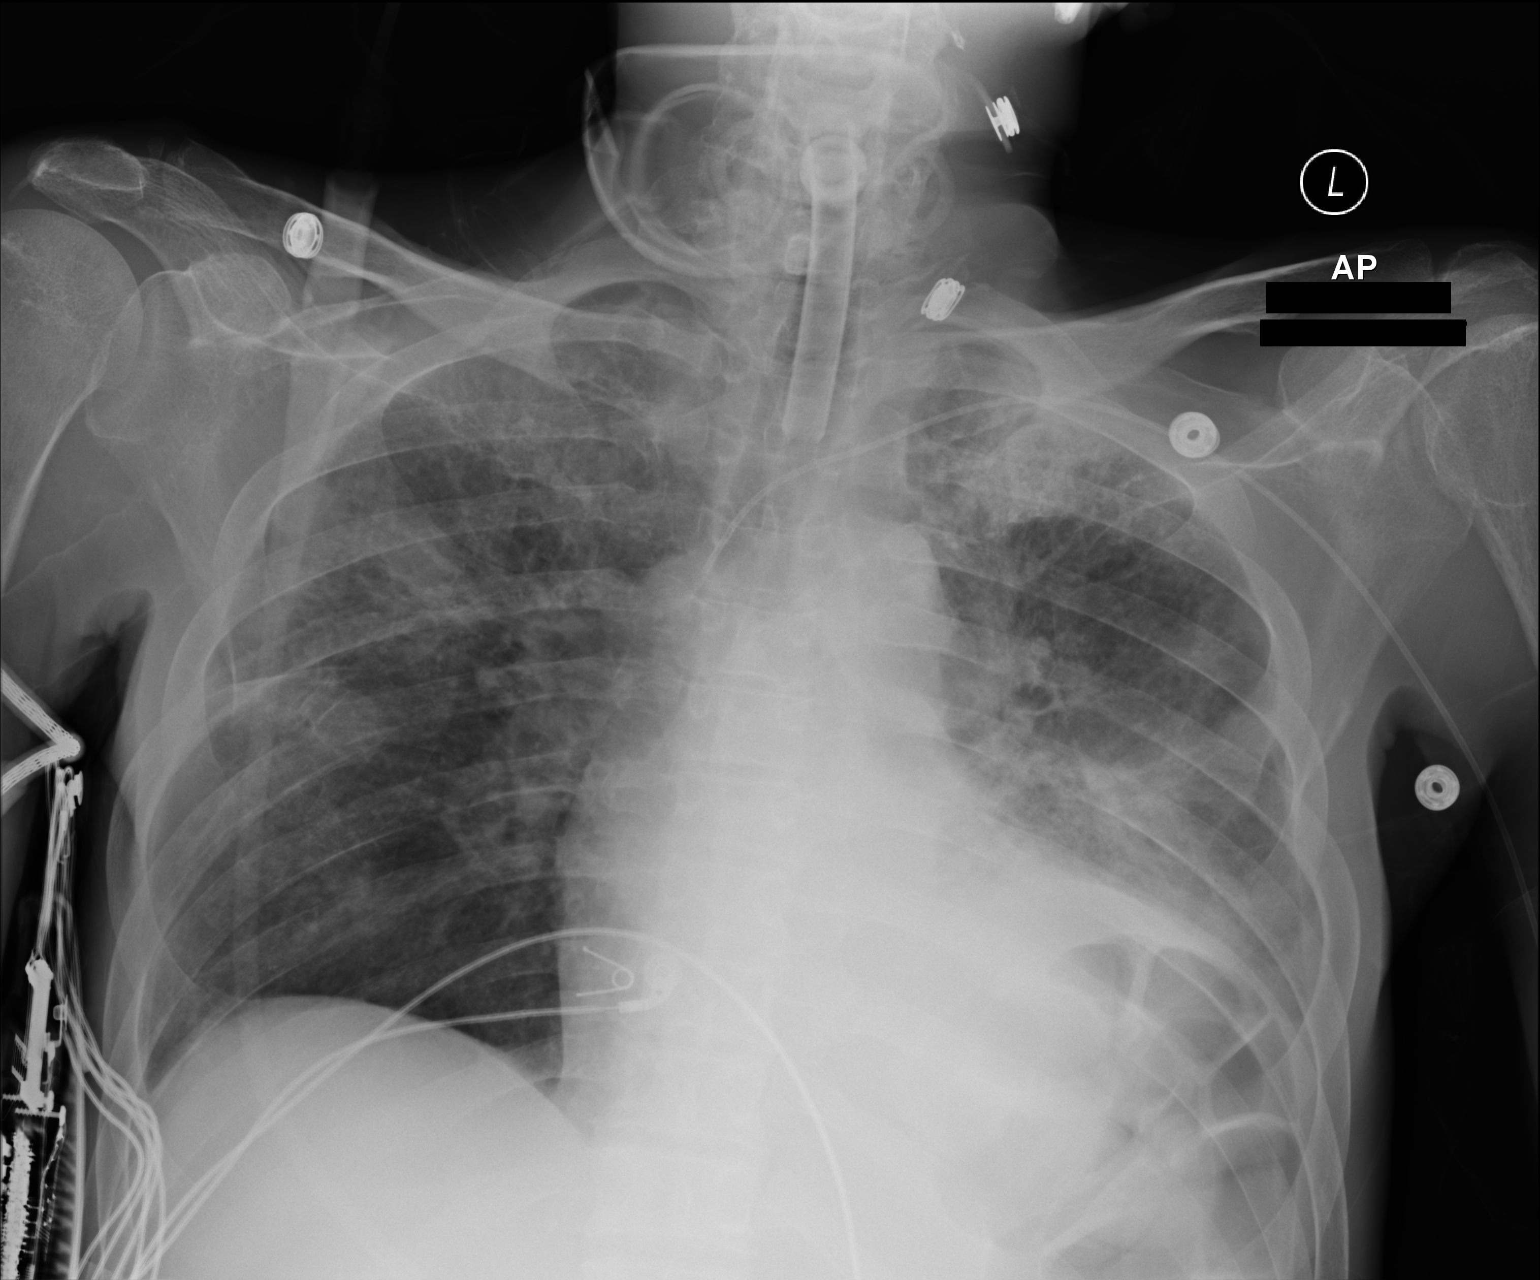

[1 of 1 positions shown; findings below may reference images not displayed]

FINDINGS: Left pleural effusion improved and there is no pneumothorax post
thoracentesis. Right lung is stable. Tracheostomy tube stable. Heart
is stable.
IMPRESSION: No pneumothorax.

## 2016-01-30 IMAGING — CR DG CHEST 1V PORT
1 series · 1 of 1 positions shown · non-contrast
Comparison: Same day.

CLINICAL DATA: PICC placement.

EXAM:
PORTABLE CHEST - 1 VIEW

[AP]
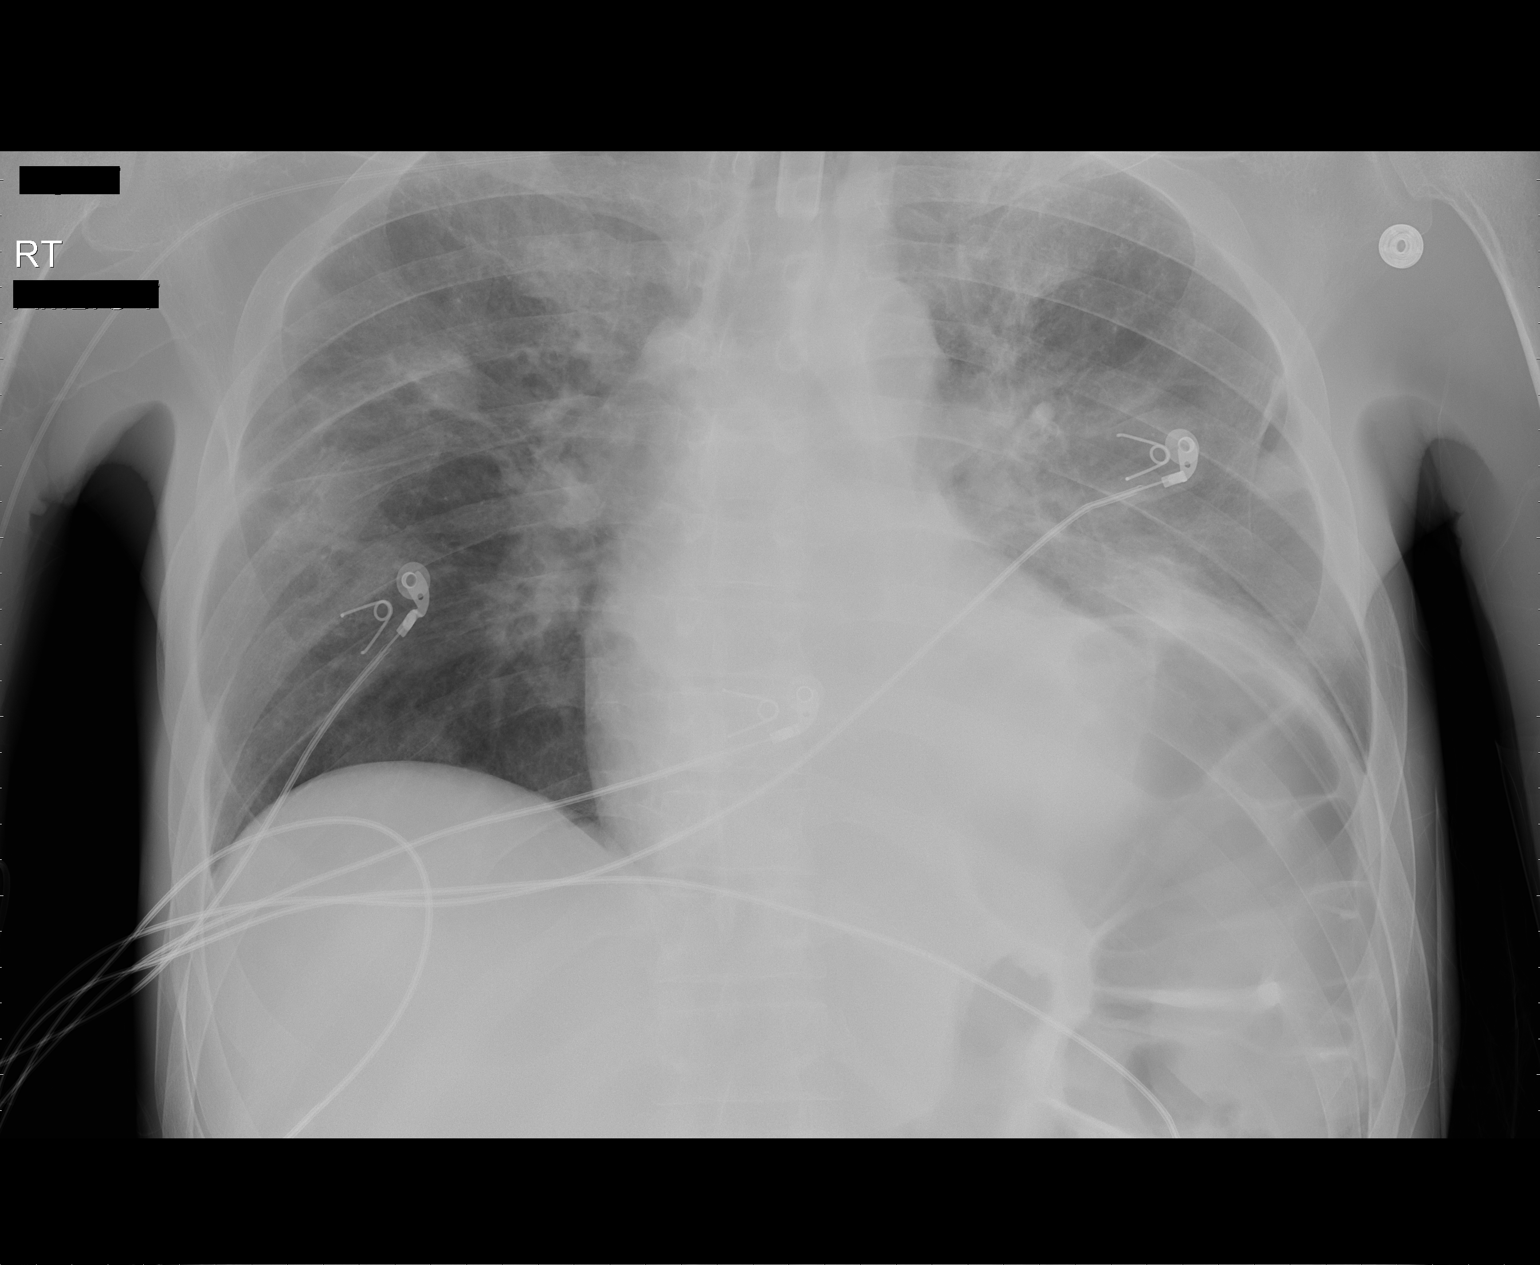

[1 of 1 positions shown; findings below may reference images not displayed]

FINDINGS: Tracheostomy tube is in grossly good position. Elevated left
hemidiaphragm is noted. Increased bilateral perihilar opacities are
noted concerning for edema or possibly pneumonia. This is worse on
the left. Stable nodular density seen in the right upper lobe. No
pneumothorax or significant pleural effusion is noted. Tip of
right-sided PICC line has been partially withdrawn, with distal tip
in expected position of right subclavian vein.
IMPRESSION: Right-sided PICC line has been partially withdrawn, with distal tip
in expected position of right subclavian vein.

Increased bilateral perihilar opacities are noted concerning for
worsening edema or pneumonia.

Stable nodular density is noted in the right upper lobe. Short-term
follow-up radiographs or CT scan is recommended to rule out
pulmonary nodule or mass.

## 2016-01-30 IMAGING — CR DG CHEST 1V PORT
1 series · 1 of 1 positions shown · non-contrast
Comparison: 02/12/2014.

CLINICAL DATA: 43-year-old male with pleural effusion. Subsequent
encounter.

EXAM:
PORTABLE CHEST - 1 VIEW

[AP]
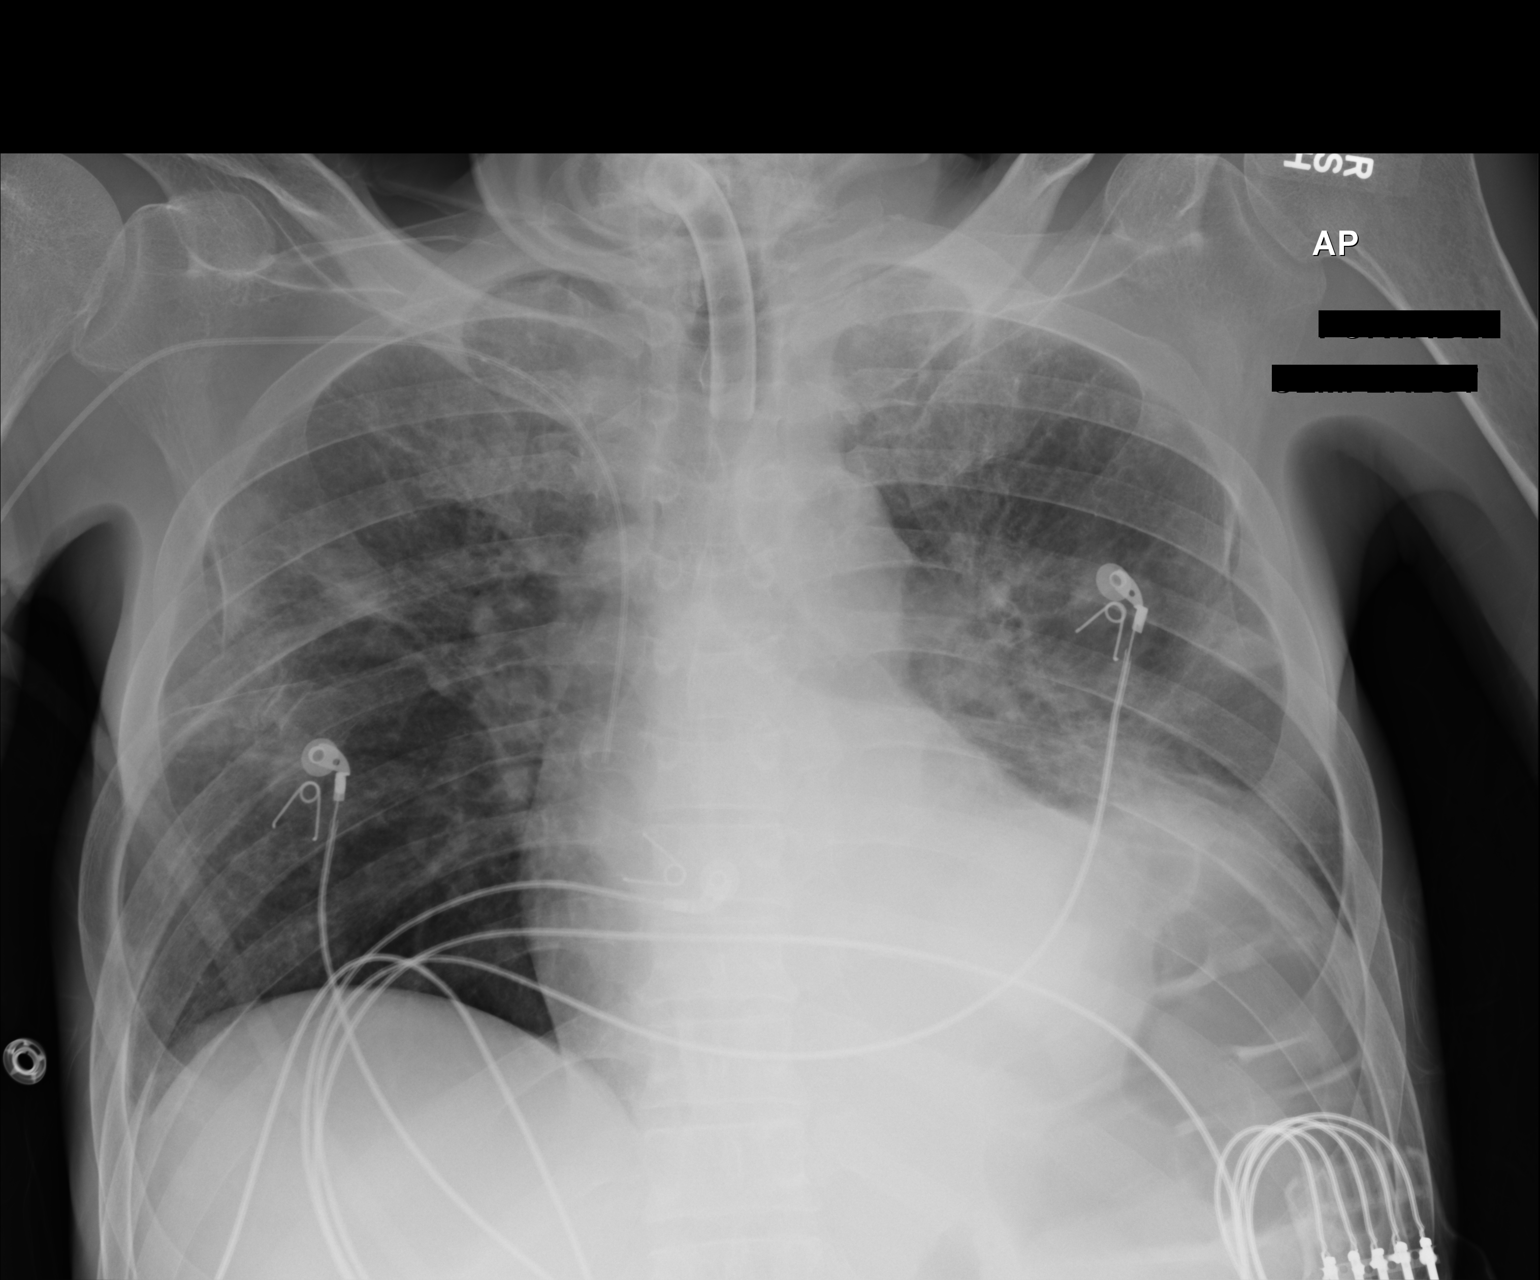

[1 of 1 positions shown; findings below may reference images not displayed]

FINDINGS: Tracheostomy tube tip midline.

Right PICC line tip distal superior vena cava level.

Elevated left hemidiaphragm.

Pulmonary vascular congestion.

Left base subsegmental atelectasis. Small residual left-sided
pleural effusion not excluded. No gross pneumothorax.

Mild nodularity right upper lobe. Underlying mass or infiltrate not
excluded. Attention to this on follow up.

Heart size within normal limits.
IMPRESSION: Tracheostomy tube tip midline.

Right PICC line tip distal superior vena cava level.

Elevated left hemidiaphragm. Left base subsegmental atelectasis.
Small residual left-sided pleural effusion not excluded. No gross
pneumothorax.

Pulmonary vascular congestion.

Mild nodularity right upper lobe. Underlying mass or infiltrate not
excluded. Attention to this on follow up.

## 2016-02-03 IMAGING — CR DG CHEST 1V PORT
1 series · 1 of 1 positions shown · non-contrast
Comparison: Was recent prior chest x-ray 02/17/2014; most recent
prior chest CT 02/14/2014

CLINICAL DATA: 43-year-old male currently admitted for respiratory
failure. Patient is disoriented and combative.

EXAM:
PORTABLE CHEST - 1 VIEW

[AP]
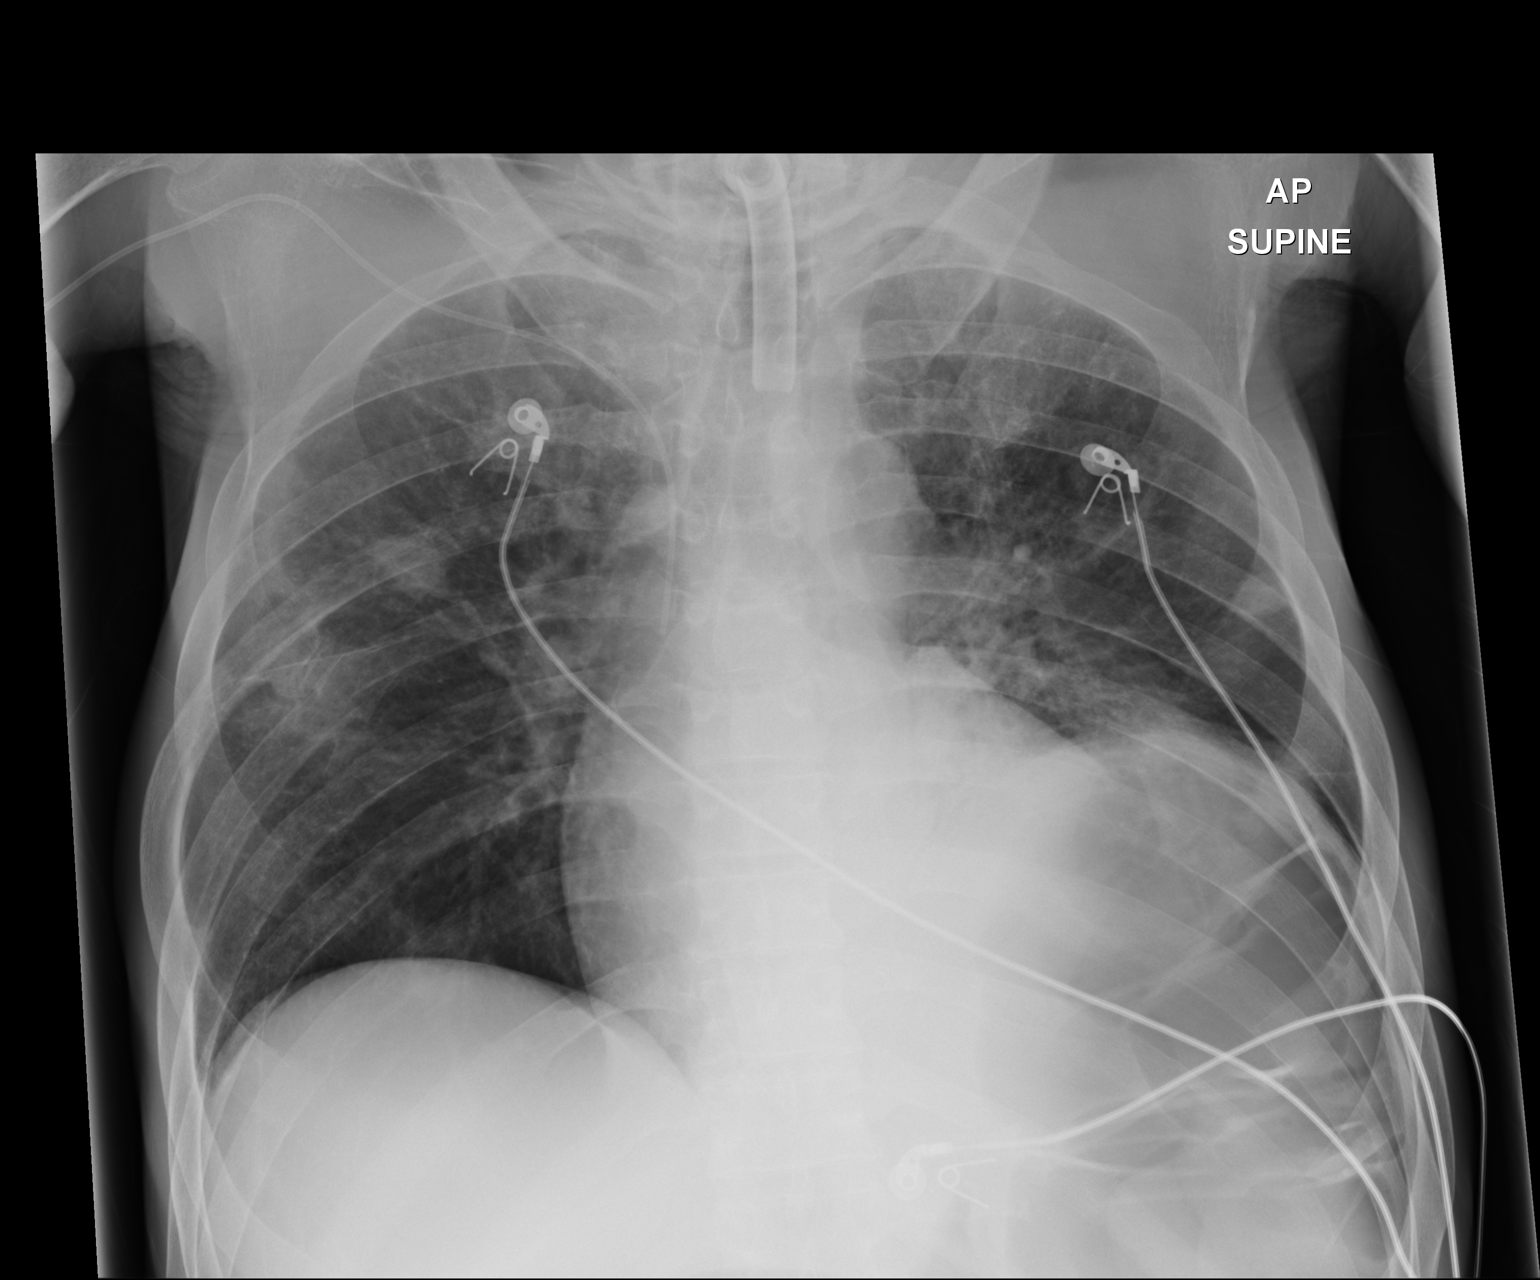

[1 of 1 positions shown; findings below may reference images not displayed]

FINDINGS: Stable position of tracheostomy tube. Tip is midline at the level of
the clavicles. Right upper extremity PICC has pulled back slightly.
The tip remains in the mid SVC. Cardiac and mediastinal contours are
unchanged. Chronic elevation of the left hemidiaphragm is similar
compared to prior. Stable appearance of multiple parenchymal nodules
bilaterally which are predominantly peripheral in location.
Persistent left basilar atelectasis versus scarring. No pneumothorax
or knee effusion. No acute osseous abnormality.
IMPRESSION: 1. Right upper extremity PICC has pulled back slightly. The tip
remains within the mid SVC.
2. No significant interval change in the appearance of the lungs
with chronic elevation of left hemidiaphragm, left basilar
atelectasis/scarring and multifocal peripheral nodular opacities
bilaterally.

## 2016-02-08 IMAGING — CR DG CHEST 1V PORT
1 series · 1 of 1 positions shown · non-contrast
Comparison: 02/18/2014

CLINICAL DATA: Status post PICC line insertion.

EXAM:
PORTABLE CHEST - 1 VIEW

[AP]
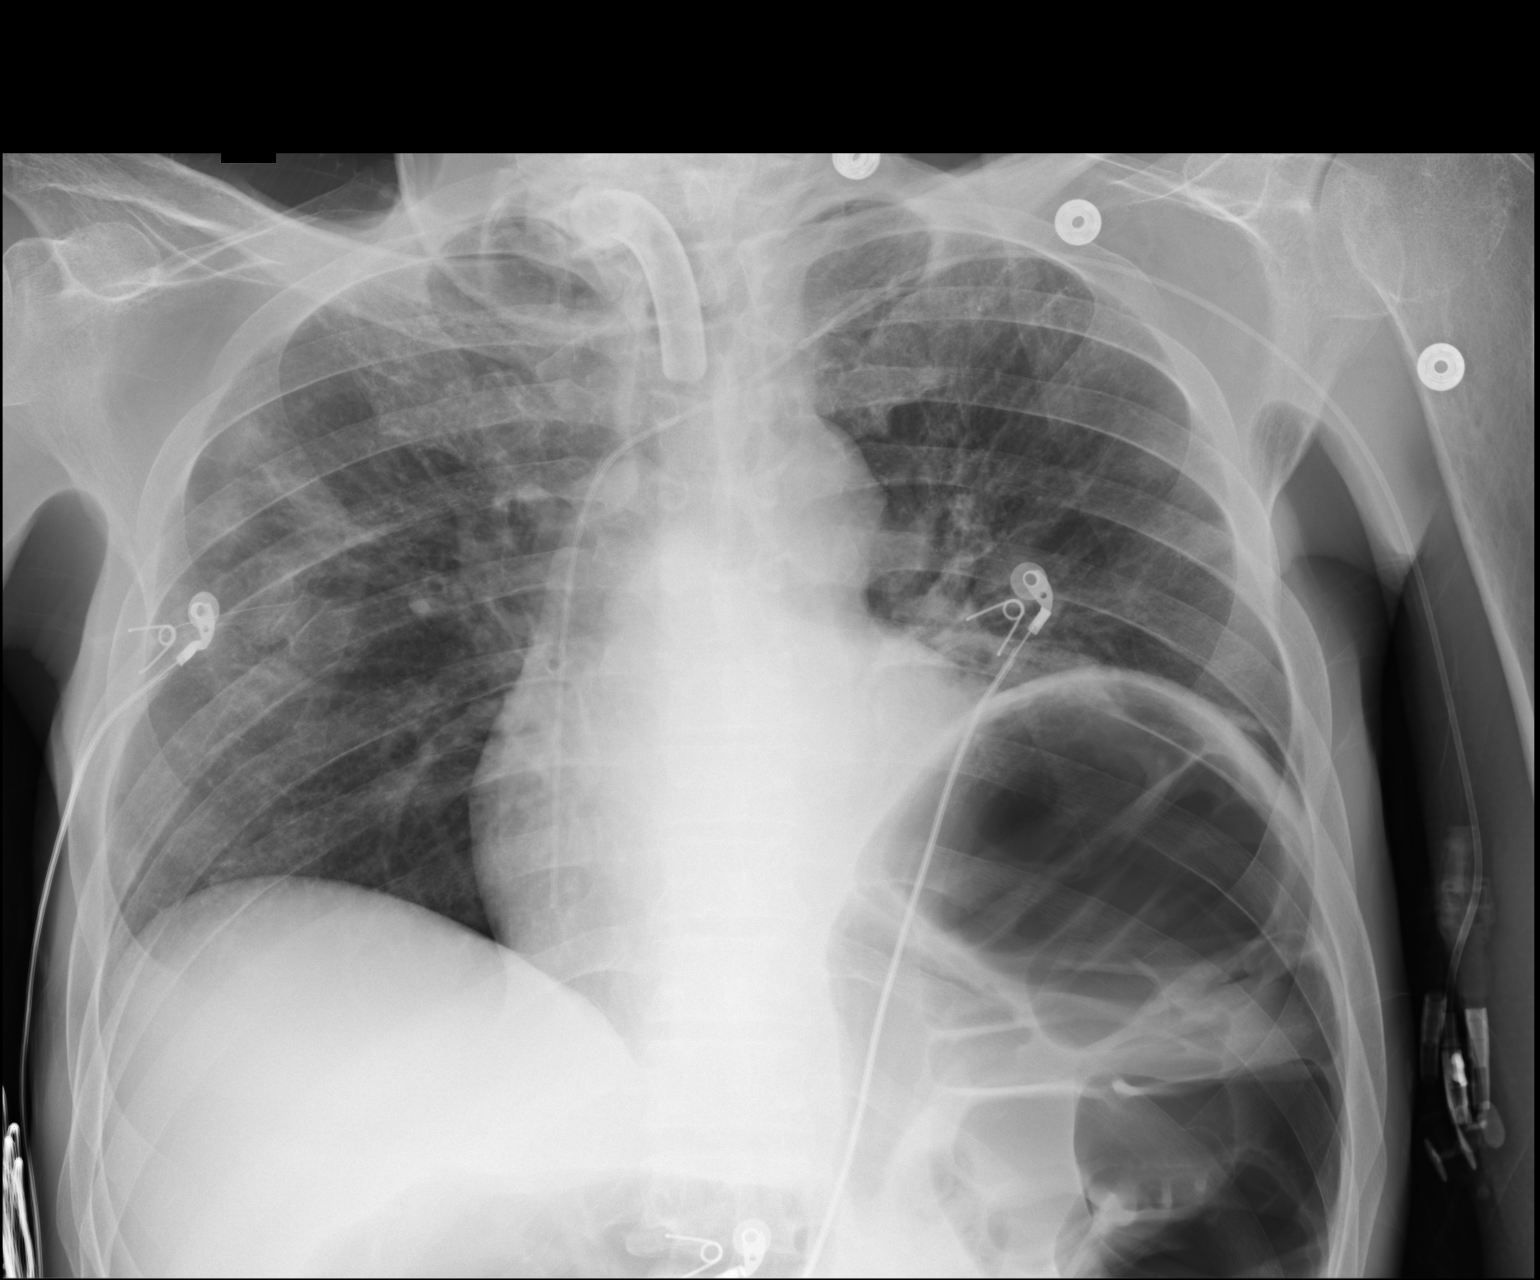

[1 of 1 positions shown; findings below may reference images not displayed]

FINDINGS: Tracheostomy tube overlies the airway. Right PICC has been removed.
New left PICC has been placed with tip projecting over the right
atrium. Cardiomediastinal silhouette is unchanged. There is
persistent elevation of the left hemidiaphragm with moderate gaseous
distention of the splenic flexure. Nodular bilateral lung opacities
are stable to slightly improved compared to the prior examination.
No new areas of airspace consolidation are identified. No pleural
effusion or pneumothorax is seen. Left basilar subsegmental
atelectasis is noted. No acute osseous abnormality is seen.
IMPRESSION: 1. Left PICC placement with tip projecting over the mid to lower
right atrium. This could be retracted approximately 4.[address]
the tip near the cavoatrial junction.
2. Stable to slightly improved bilateral nodular lung opacities.
